# Patient Record
Sex: Male | Born: 1975 | Race: Black or African American | Hispanic: No | Marital: Single | Smoking: Current every day smoker
Health system: Southern US, Community
[De-identification: ages and names within clinical notes are randomized; demographics above are authoritative.]

---

## 2018-07-21 ENCOUNTER — Emergency Department (HOSPITAL_COMMUNITY): Payer: BLUE CROSS/BLUE SHIELD

## 2018-07-21 ENCOUNTER — Emergency Department (HOSPITAL_COMMUNITY)
Admission: EM | Admit: 2018-07-21 | Discharge: 2018-07-21 | Disposition: A | Discharge status: Home / Self Care | Payer: BLUE CROSS/BLUE SHIELD | Attending: Emergency Medicine | Admitting: Emergency Medicine

## 2018-07-21 ENCOUNTER — Encounter (HOSPITAL_COMMUNITY): Payer: Self-pay | Admitting: *Deleted

## 2018-07-21 DIAGNOSIS — F17210 Nicotine dependence, cigarettes, uncomplicated: Secondary | ICD-10-CM | POA: Insufficient documentation

## 2018-07-21 DIAGNOSIS — R110 Nausea: Secondary | ICD-10-CM

## 2018-07-21 DIAGNOSIS — R1013 Epigastric pain: Secondary | ICD-10-CM | POA: Diagnosis not present

## 2018-07-21 DIAGNOSIS — R109 Unspecified abdominal pain: Secondary | ICD-10-CM | POA: Diagnosis present

## 2018-07-21 LAB — COMPREHENSIVE METABOLIC PANEL
ALT: 12 U/L (ref 0–44)
AST: 16 U/L (ref 15–41)
Albumin: 3.6 g/dL (ref 3.5–5.0)
Alkaline Phosphatase: 62 U/L (ref 38–126)
Anion gap: 12 (ref 5–15)
BUN: 5 mg/dL — ABNORMAL LOW (ref 6–20)
CO2: 25 mmol/L (ref 22–32)
CREATININE: 0.89 mg/dL (ref 0.61–1.24)
Calcium: 9.6 mg/dL (ref 8.9–10.3)
Chloride: 102 mmol/L (ref 98–111)
GFR calc Af Amer: >60 mL/min (ref >60)
GFR calc non Af Amer: >60 mL/min (ref >60)
Glucose, Bld: 92 mg/dL (ref 70–99)
Potassium: 4.7 mmol/L (ref 3.5–5.1)
Sodium: 139 mmol/L (ref 135–145)
Total Bilirubin: 0.6 mg/dL (ref 0.3–1.2)
Total Protein: 7.2 g/dL (ref 6.5–8.1)

## 2018-07-21 LAB — CBC
HCT: 32.4 % — ABNORMAL LOW (ref 39.0–52.0)
Hemoglobin: 10.3 g/dL — ABNORMAL LOW (ref 13.0–17.0)
MCH: 29.7 pg (ref 26.0–34.0)
MCHC: 31.8 g/dL (ref 30.0–36.0)
MCV: 93.4 fL (ref 80.0–100.0)
Platelets: 552 10*3/uL — ABNORMAL HIGH (ref 150–400)
RBC: 3.47 MIL/uL — ABNORMAL LOW (ref 4.22–5.81)
RDW: 15.7 % — AB (ref 11.5–15.5)
WBC: 8.3 10*3/uL (ref 4.0–10.5)
nRBC: 0 % (ref 0.0–0.2)

## 2018-07-21 LAB — TROPONIN I: Troponin I: <0.03 ng/mL (ref <0.03)

## 2018-07-21 LAB — TYPE AND SCREEN
ABO/RH(D): B NEG
Antibody Screen: NEGATIVE

## 2018-07-21 LAB — LIPASE, BLOOD: Lipase: 33 U/L (ref 11–51)

## 2018-07-21 LAB — ABO/RH: ABO/RH(D): B NEG

## 2018-07-21 MED ORDER — SODIUM CHLORIDE 0.9 % IV BOLUS
1000.0000 mL | Freq: Once | INTRAVENOUS | Status: AC
  Administered 2018-07-21: 1000 mL via INTRAVENOUS

## 2018-07-21 MED ORDER — ONDANSETRON HCL 4 MG/2ML IJ SOLN
4.0000 mg | Freq: Once | INTRAMUSCULAR | Status: AC
  Administered 2018-07-21: 4 mg via INTRAVENOUS
  Filled 2018-07-21: qty 2

## 2018-07-21 MED ORDER — ALUM & MAG HYDROXIDE-SIMETH 400-400-40 MG/5ML PO SUSP: 10.0000 mL | Freq: Four times a day (QID) | ORAL | 0 refills | Status: AC | PRN

## 2018-07-21 MED ORDER — ONDANSETRON 4 MG PO TBDP: 4.0000 mg | ORAL_TABLET | Freq: Three times a day (TID) | ORAL | 0 refills | Status: AC | PRN

## 2018-07-21 MED ORDER — SODIUM CHLORIDE 0.9 % IV SOLN
80.0000 mg | Freq: Once | INTRAVENOUS | Status: AC
  Administered 2018-07-21: 80 mg via INTRAVENOUS
  Filled 2018-07-21: qty 80

## 2018-07-21 MED ORDER — MORPHINE SULFATE (PF) 4 MG/ML IV SOLN
4.0000 mg | Freq: Once | INTRAVENOUS | Status: AC
  Administered 2018-07-21: 4 mg via INTRAVENOUS
  Filled 2018-07-21: qty 1

## 2018-07-21 MED ORDER — PANTOPRAZOLE SODIUM 40 MG PO TBEC: 40.0000 mg | DELAYED_RELEASE_TABLET | Freq: Every day | ORAL | 0 refills | Status: AC

## 2018-07-21 MED ORDER — IOHEXOL 300 MG/ML  SOLN
100.0000 mL | Freq: Once | INTRAMUSCULAR | Status: AC | PRN
  Administered 2018-07-21: 100 mL via INTRAVENOUS

## 2018-07-21 NOTE — ED Provider Notes (Signed)
Emergency Department Provider Note   I have reviewed the triage vital signs and the nursing notes.   HISTORY  Chief Complaint Abdominal Pain   HPI Lucas Coleman is a 42 y.o. male with PMH of PUD with report of recent admit in [place] bleeding stomach ulcer.  Patient states that he was admitted and treated with occasion and did require a blood transfusion.  He was discharged several days ago and is now living in [place].  He has not established care with a PCP or GI.  Over the past 24 hours he is developed a burning mostly epigastric and right-sided abdominal discomfort which radiates up into the chest.  He states this feels similar to his ulcer type pain.  Patient has not started any of the medications prescribed from the hospital as of yet.  Denies shortness of breath, fevers, chills.  During his recent admission he states he was having vomiting blood and rectal bleeding which has not started as of yet.  History reviewed. No pertinent past medical history.  There are no active problems to display for this patient.   History reviewed. No pertinent surgical history.  Allergies Patient has no known allergies.  No family history on file.  Social History Social History   Tobacco Use  . Smoking status: Current Every Day Smoker    Types: Cigarettes  . Smokeless tobacco: Never Used  Substance Use Topics  . Alcohol use: Yes  . Drug use: Never    Review of Systems  Constitutional: No fever/chills Eyes: No visual changes. ENT: No sore throat. Cardiovascular: Positive chest pain. Respiratory: Denies shortness of breath. Gastrointestinal: Positive abdominal pain.  No nausea, no vomiting.  No diarrhea.  No constipation. Genitourinary: Negative for dysuria. Musculoskeletal: Negative for back pain. Skin: Negative for rash. Neurological: Negative for headaches, focal weakness or numbness.  10-point ROS otherwise  negative.  ____________________________________________   PHYSICAL EXAM:  VITAL SIGNS: ED Triage Vitals  Enc Vitals Group     BP 07/21/18 1327 127/83     Pulse Rate 07/21/18 1327 72     Resp 07/21/18 1327 16     Temp 07/21/18 1327 97.8 F (36.6 C)     Temp Source 07/21/18 1327 Oral     SpO2 07/21/18 1327 98 %     Weight 07/21/18 1339 172 lb (78 kg)     Height 07/21/18 1339 6\' 3"  (1.905 m)     Pain Score 07/21/18 1339 10   Constitutional: Alert and oriented. Well appearing and in no acute distress. Eyes: Conjunctivae are normal.  Head: Atraumatic. Nose: No congestion/rhinnorhea. Mouth/Throat: Mucous membranes are moist.  Neck: No stridor.  Cardiovascular: Normal rate, regular rhythm. Good peripheral circulation. Grossly normal heart sounds.   Respiratory: Normal respiratory effort.  No retractions. Lungs CTAB. Gastrointestinal: Soft with voluntary guarding in the upper abdomen, worse on the right. No rebound. No distention.  Musculoskeletal: No lower extremity tenderness nor edema. No gross deformities of extremities. Neurologic:  Normal speech and language. No gross focal neurologic deficits are appreciated.  Skin:  Skin is warm, dry and intact. No rash noted.  ____________________________________________   LABS (all labs ordered are listed, but only abnormal results are displayed)  Labs Reviewed  COMPREHENSIVE METABOLIC PANEL - Abnormal; Notable for the following components:      Result Value   BUN 5 (*)    All other components within normal limits  CBC - Abnormal; Notable for the following components:   RBC 3.47 (*)  Hemoglobin 10.3 (*)    HCT 32.4 (*)    RDW 15.7 (*)    Platelets 552 (*)    All other components within normal limits  LIPASE, BLOOD  TROPONIN I  POC OCCULT BLOOD, ED  TYPE AND SCREEN  ABO/RH   ____________________________________________  EKG   EKG Interpretation  Date/Time:  Saturday July 21 2018 15:15:01 EST Ventricular Rate:   60 PR Interval:    QRS Duration: 93 QT Interval:  458 QTC Calculation: 458 R Axis:   77 Text Interpretation:  Sinus rhythm Consider left ventricular hypertrophy Non-specific ST and T wave changes. No STEMI. No prior tracing for comparison.  Confirmed by Alona Bene [phone]) on 07/21/2018 3:24:36 PM       ____________________________________________  RADIOLOGY  Ct Abdomen Pelvis W Contrast  Result Date: 07/21/2018 CLINICAL DATA:  Abdominal pain and burning.  History of GI bleed. EXAM: CT ABDOMEN AND PELVIS WITH CONTRAST TECHNIQUE: Multidetector CT imaging of the abdomen and pelvis was performed using the standard protocol following bolus administration of intravenous contrast. CONTRAST:  100 mL OMNIPAQUE IOHEXOL 300 MG/ML  SOLN COMPARISON:  Chest and two views abdomen today. FINDINGS: Lower chest: Lung bases are clear. No pleural or pericardial effusion. Heart size is normal. Hepatobiliary: No focal liver abnormality is seen. No gallstones, gallbladder wall thickening, or biliary dilatation. Pancreas: Unremarkable. No pancreatic ductal dilatation or surrounding inflammatory changes. Spleen: Normal in size without focal abnormality. Adrenals/Urinary Tract: Only a tiny remnant of the right kidney is identified. Compensatory hypertrophy of the left kidney is noted. Two small nonobstructing stones in lower pole of the left kidney are identified. The larger measures 0.4 cm. Left ureter and urinary bladder appear normal. The adrenal glands are normal in appearance. Stomach/Bowel: Stomach is within normal limits. Appendix appears normal. No evidence of bowel wall thickening, distention, or inflammatory changes. Vascular/Lymphatic: Aortic atherosclerosis. No enlarged abdominal or pelvic lymph nodes. Reproductive: Prostate is unremarkable. Other: None. Musculoskeletal: No acute or focal abnormality. IMPRESSION: No acute abnormality abdomen or pelvis. No finding to explain the patient's symptoms. Only a tiny  remnant of the right kidney is identified likely secondary to some developmental anomaly. Compensatory hypertrophy of the left kidney is noted. Two small nonobstructing stones lower pole left kidney. Atherosclerosis. Electronically Signed   By: Drusilla Kanner M.D.   On: 07/21/2018 17:11   Dg Abdomen Acute W/chest  Result Date: 07/21/2018 CLINICAL DATA:  Abdominal pain. EXAM: DG ABDOMEN ACUTE W/ 1V CHEST COMPARISON:  None. FINDINGS: Suspected 4 mm stone in the lower pole left kidney. Calcifications in the pelvis are nonspecific but may represent phleboliths. Views of the chest are normal. No free air, portal venous gas, or pneumatosis. No bowel obstruction. IMPRESSION: 1. There appear to be 1 or 2 calcifications in the lower pole left kidney suggesting renal stones. Calcifications in the pelvis are nonspecific but may represent phleboliths. 2. No other acute abnormalities. Electronically Signed   By: Gerome Sam III M.D   On: 07/21/2018 15:24    ____________________________________________   PROCEDURES  Procedure(s) performed:   Procedures  None ____________________________________________   INITIAL IMPRESSION / ASSESSMENT AND PLAN / ED COURSE  Pertinent labs & imaging results that were available during my care of the patient were reviewed by me and considered in my medical decision making (see chart for details).  Patient presents to the emergency department primarily with abdominal pain radiating up into the chest.  Describes it as burning and similar to ulcer type pain which has  had in the past.  Unclear if his last hospitalization resulted in a perforated ulcer or simply a bleeding ulcer.  He has not had any new medications after discharge.  Vital signs are normal.  Patient does have voluntary guarding in the upper abdomen worse on the right.  Plan for acute abdomen series to rule out acute perforation although suspicion for this is lower.  Plan for Protonix, IV fluids, pain/nausea  medication.  Patient will likely require CT imaging of the abdomen and pelvis but will follow plain film first.  Plain films and labs reviewed. No acute findings. CT negative for acute process. Plan to start Protonix, Maalox, and refer to PCP/GI. Discussed results and ED return precautions in detail.  ____________________________________________  FINAL CLINICAL IMPRESSION(S) / ED DIAGNOSES  Final diagnoses:  Epigastric pain  Nausea     MEDICATIONS GIVEN DURING THIS VISIT:  Medications  sodium chloride 0.9 % bolus 1,000 mL (0 mLs Intravenous Stopped 07/21/18 1604)  pantoprazole (PROTONIX) 80 mg in sodium chloride 0.9 % 100 mL IVPB (0 mg Intravenous Stopped 07/21/18 1604)  morphine 4 MG/ML injection 4 mg (4 mg Intravenous Given 07/21/18 1530)  ondansetron (ZOFRAN) injection 4 mg (4 mg Intravenous Given 07/21/18 1530)  iohexol (OMNIPAQUE) 300 MG/ML solution 100 mL (100 mLs Intravenous Contrast Given 07/21/18 1643)     NEW OUTPATIENT MEDICATIONS STARTED DURING THIS VISIT:  Discharge Medication List as of 07/21/2018  5:49 PM    START taking these medications   Details  alum & mag hydroxide-simeth (MAALOX MAX) 400-400-40 MG/5ML suspension Take 10 mLs by mouth every 6 (six) hours as needed for indigestion., Starting Sat 07/21/2018, Print    ondansetron (ZOFRAN ODT) 4 MG disintegrating tablet Take 1 tablet (4 mg total) by mouth every 8 (eight) hours as needed for nausea or vomiting., Starting Sat 07/21/2018, Print    pantoprazole (PROTONIX) 40 MG tablet Take 1 tablet (40 mg total) by mouth daily., Starting Sat 07/21/2018, Until Mon 08/20/2018, Print        Note:  This document was prepared using Dragon voice recognition software and may include unintentional dictation errors.  Alona Bene, MD Emergency Medicine    Arletha Marschke, Arlyss Repress, MD 07/21/18 [phone]

## 2018-07-21 NOTE — ED Triage Notes (Signed)
Pt is here for abdominal pain which extends from throat to pelvis.  Worst pain is burning in chest.  Pt reports recent hospitalization in [place] for "internal bleeding"  He states that he had "blood coming out of my mouth and my rear".  He states that the pain he is having is the same as before he had the bleeding.

## 2018-07-21 NOTE — Discharge Instructions (Signed)

## 2019-11-01 IMAGING — CT CT ABD-PELV W/ CM
2 of 5 series · 16 of 46 positions shown, 18 images · IV contrast (APPLIED)
Comparison: Chest and two views abdomen today.

CLINICAL DATA: Abdominal pain and burning.  History of GI bleed.

EXAM:
CT ABDOMEN AND PELVIS WITH CONTRAST
TECHNIQUE: Multidetector CT imaging of the abdomen and pelvis was performed
using the standard protocol following bolus administration of
intravenous contrast.
CONTRAST:  100 mL OMNIPAQUE IOHEXOL 300 MG/ML  SOLN

[Series 3: abd/ pelvis 5.0 i30f 2 · axial · 0.84mm/px · z∈[+823,+1243]mm · 13 of 94 slices shown, 15 images]
[im 5/94  soft-tissue]
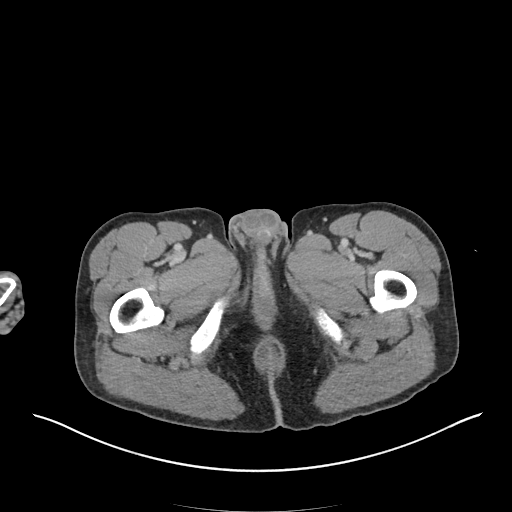
[im 5/94  bone]
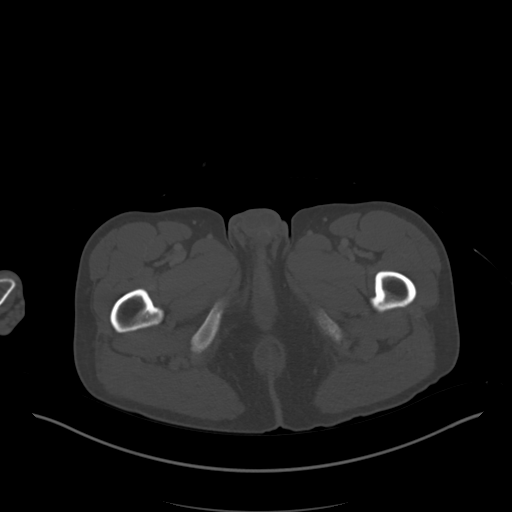
[im 14/94  soft-tissue]
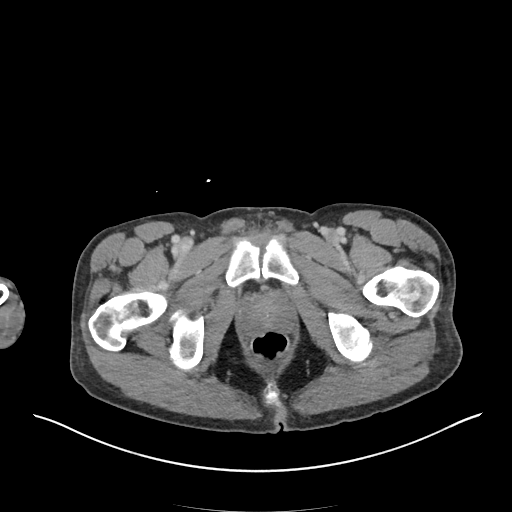
[im 19/94  soft-tissue]
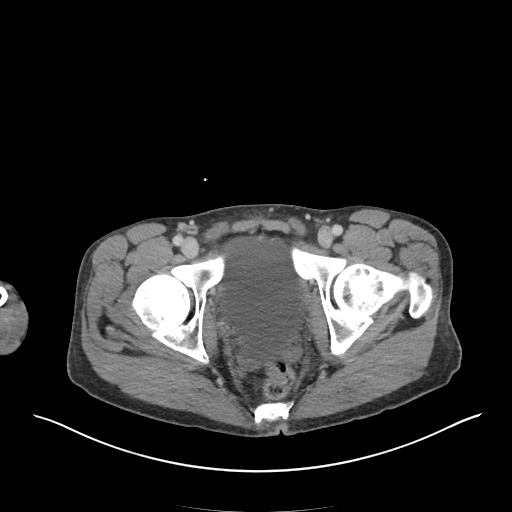
[im 28/94  soft-tissue]
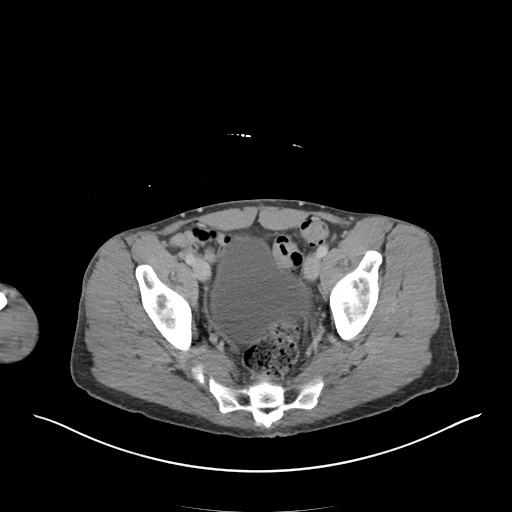
[im 33/94  soft-tissue]
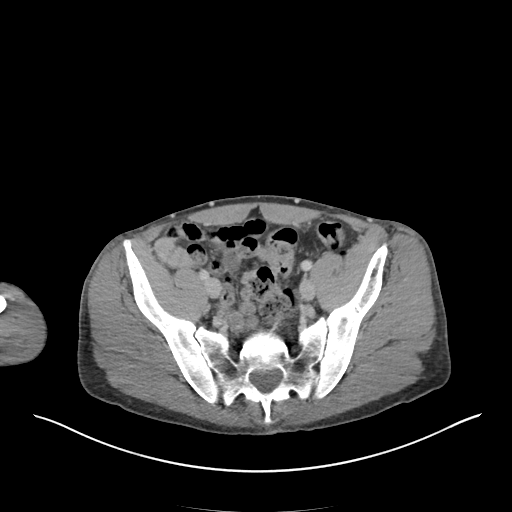
[im 42/94  soft-tissue]
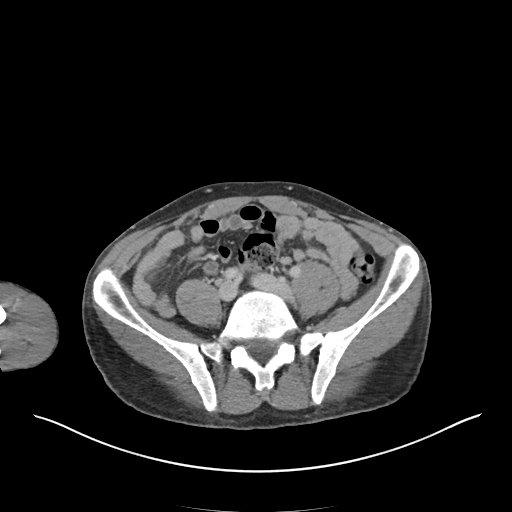
[im 47/94  soft-tissue]
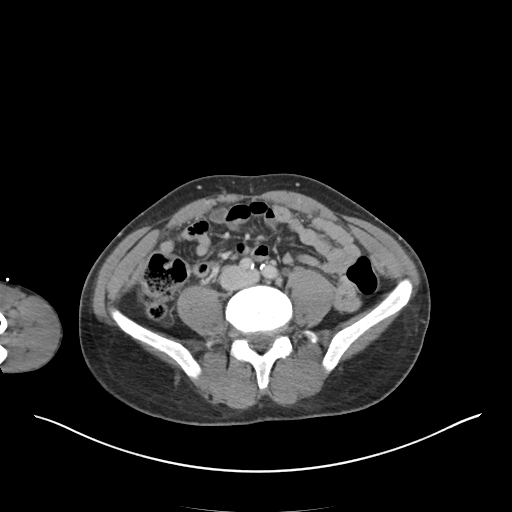
[im 52/94  soft-tissue]
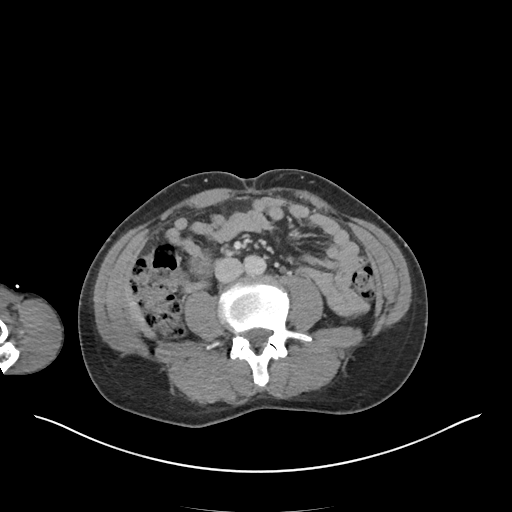
[im 61/94  soft-tissue]
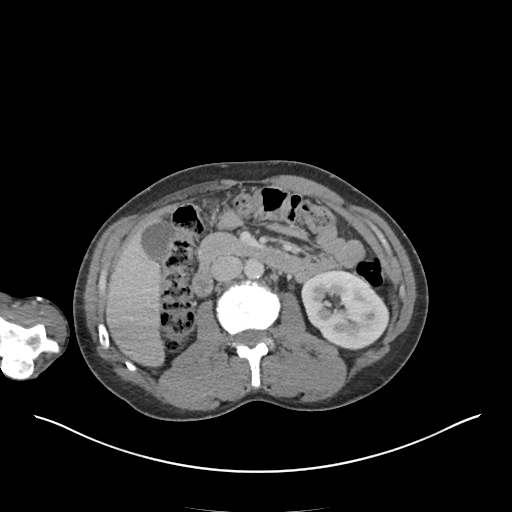
[im 61/94  bone]
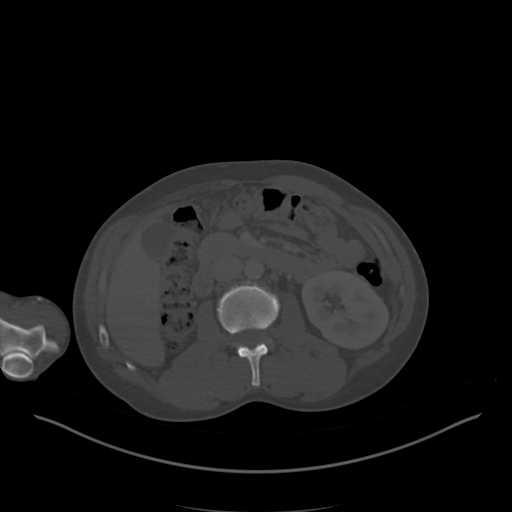
[im 66/94  soft-tissue]
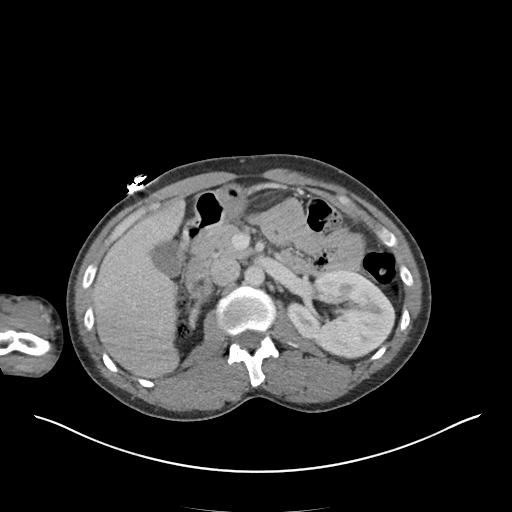
[im 75/94  soft-tissue]
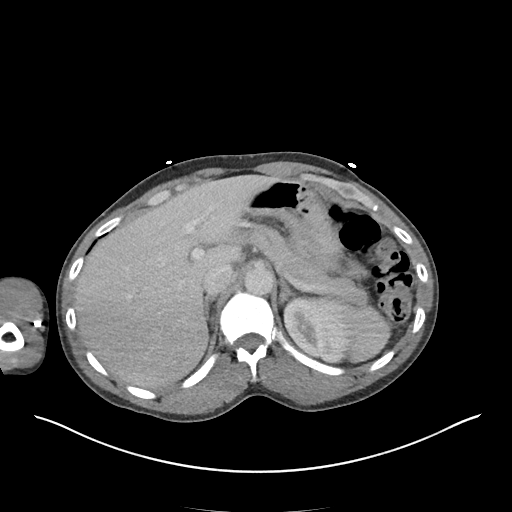
[im 80/94  soft-tissue]
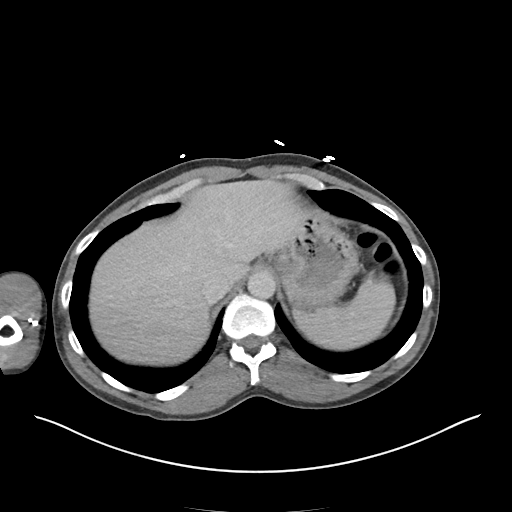
[im 89/94  soft-tissue]
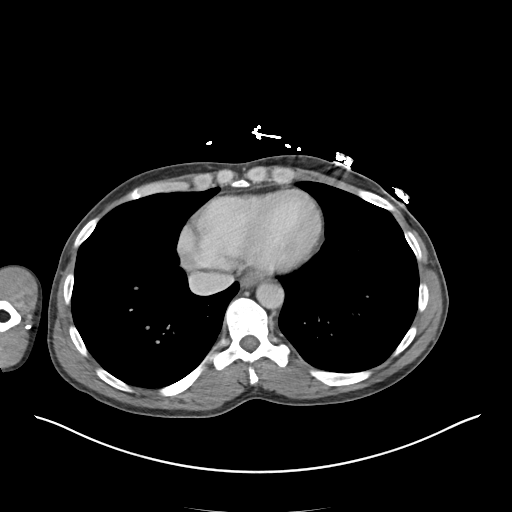

[Series 6: coronal soft tissue · coronal · 0.79mm/px · 3 of 101 slices shown]
[im 34/101  soft-tissue]
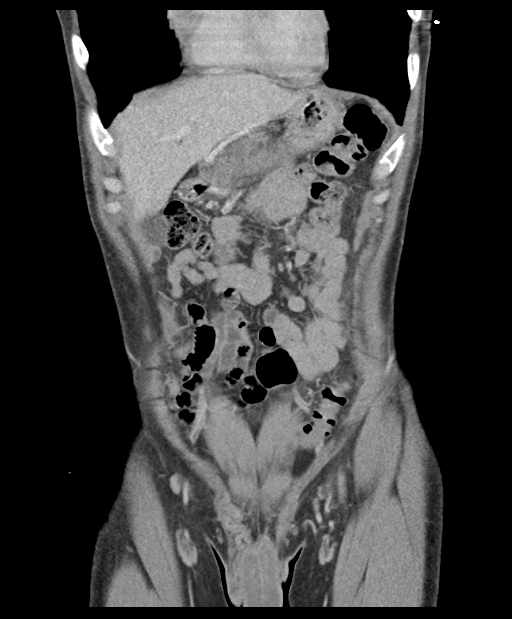
[im 45/101  soft-tissue]
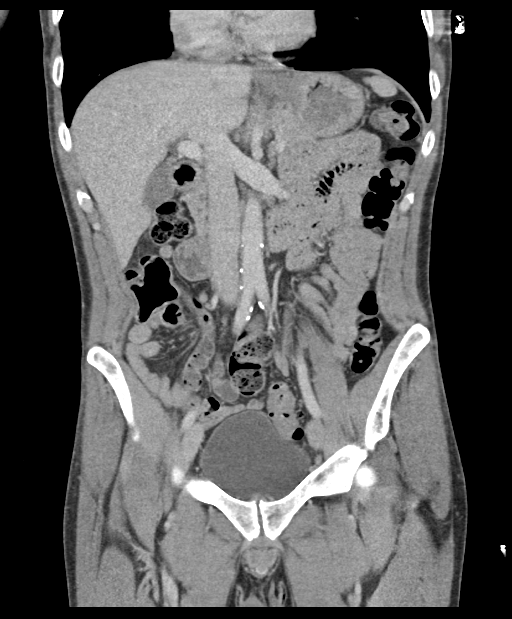
[im 56/101  soft-tissue]
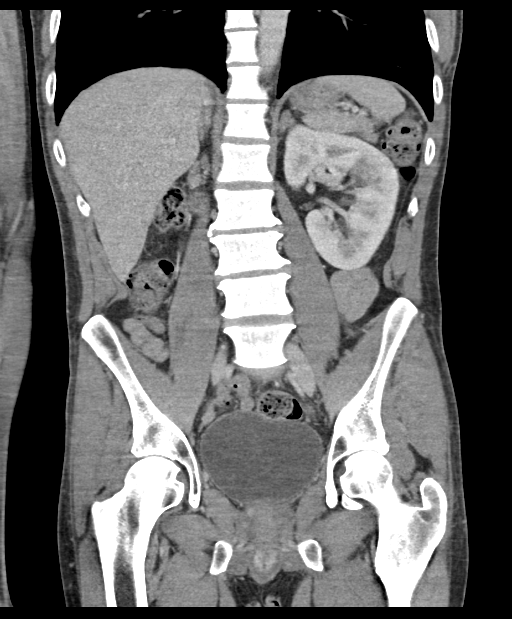

[16 of 46 positions shown; findings below may reference images not displayed]

FINDINGS: Lower chest: Lung bases are clear. No pleural or pericardial
effusion. Heart size is normal.

Hepatobiliary: No focal liver abnormality is seen. No gallstones,
gallbladder wall thickening, or biliary dilatation.

Pancreas: Unremarkable. No pancreatic ductal dilatation or
surrounding inflammatory changes.

Spleen: Normal in size without focal abnormality.

Adrenals/Urinary Tract: Only a tiny remnant of the right kidney is
identified. Compensatory hypertrophy of the left kidney is noted.
Two small nonobstructing stones in lower pole of the left kidney are
identified. The larger measures 0.4 cm. Left ureter and urinary
bladder appear normal. The adrenal glands are normal in appearance.

Stomach/Bowel: Stomach is within normal limits. Appendix appears
normal. No evidence of bowel wall thickening, distention, or
inflammatory changes.

Vascular/Lymphatic: Aortic atherosclerosis. No enlarged abdominal or
pelvic lymph nodes.

Reproductive: Prostate is unremarkable.

Other: None.

Musculoskeletal: No acute or focal abnormality.
IMPRESSION: No acute abnormality abdomen or pelvis. No finding to explain the
patient's symptoms.

Only a tiny remnant of the right kidney is identified likely
secondary to some developmental anomaly. Compensatory hypertrophy of
the left kidney is noted.

Two small nonobstructing stones lower pole left kidney.

Atherosclerosis.
